# Patient Record
Sex: Female | Born: 1995 | Race: White | Hispanic: No | Marital: Single | Smoking: Never smoker
Health system: Southern US, Community
[De-identification: ages and names within clinical notes are randomized; demographics above are authoritative.]

## PROBLEM LIST (undated history)

## (undated) DIAGNOSIS — F988 Other specified behavioral and emotional disorders with onset usually occurring in childhood and adolescence: Secondary | ICD-10-CM

## (undated) DIAGNOSIS — K219 Gastro-esophageal reflux disease without esophagitis: Secondary | ICD-10-CM

## (undated) DIAGNOSIS — R161 Splenomegaly, not elsewhere classified: Secondary | ICD-10-CM

## (undated) DIAGNOSIS — D649 Anemia, unspecified: Secondary | ICD-10-CM

## (undated) DIAGNOSIS — F32A Depression, unspecified: Secondary | ICD-10-CM

## (undated) DIAGNOSIS — F419 Anxiety disorder, unspecified: Secondary | ICD-10-CM

## (undated) DIAGNOSIS — F329 Major depressive disorder, single episode, unspecified: Secondary | ICD-10-CM

## (undated) HISTORY — PX: FRACTURE SURGERY: SHX138

## (undated) HISTORY — DX: Other specified behavioral and emotional disorders with onset usually occurring in childhood and adolescence: F98.8

## (undated) HISTORY — DX: Gastro-esophageal reflux disease without esophagitis: K21.9

## (undated) HISTORY — PX: HERNIA REPAIR: SHX51

## (undated) HISTORY — DX: Anemia, unspecified: D64.9

## (undated) HISTORY — PX: TYMPANOSTOMY TUBE PLACEMENT: SHX32

---
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
## 2015-01-04 IMAGING — US US ABDOMEN COMPLETE
1 series · 14 of 25 positions shown · non-contrast
Comparison: None.

CLINICAL DATA: Fatigue.  Splenomegaly.

EXAM:
ULTRASOUND ABDOMEN COMPLETE

[Series 1: us abdomen complete · 0.28mm/px · 14 of 81 slices shown]
[im 1/81]
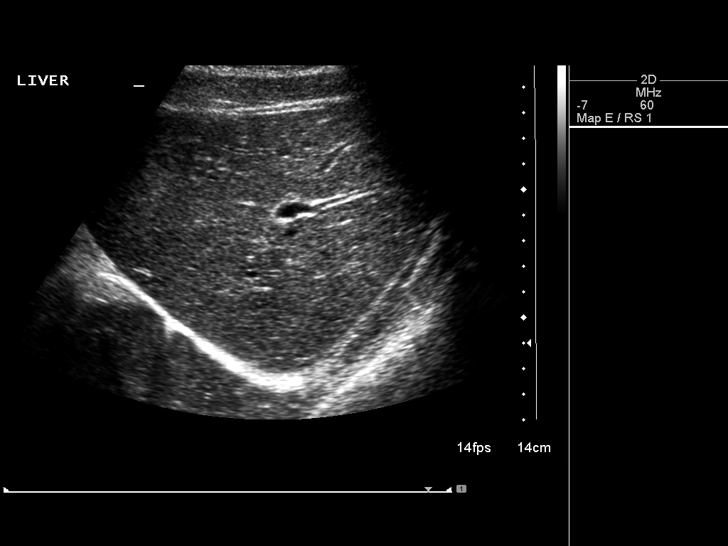
[im 7/81]
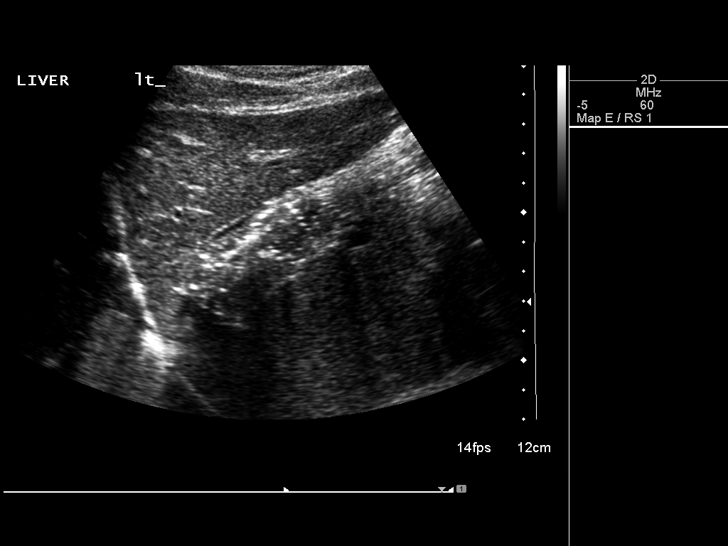
[im 14/81]
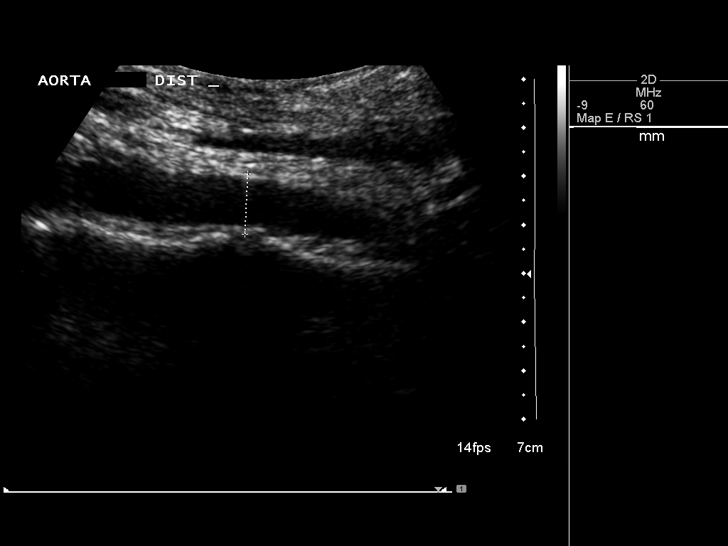
[im 21/81]
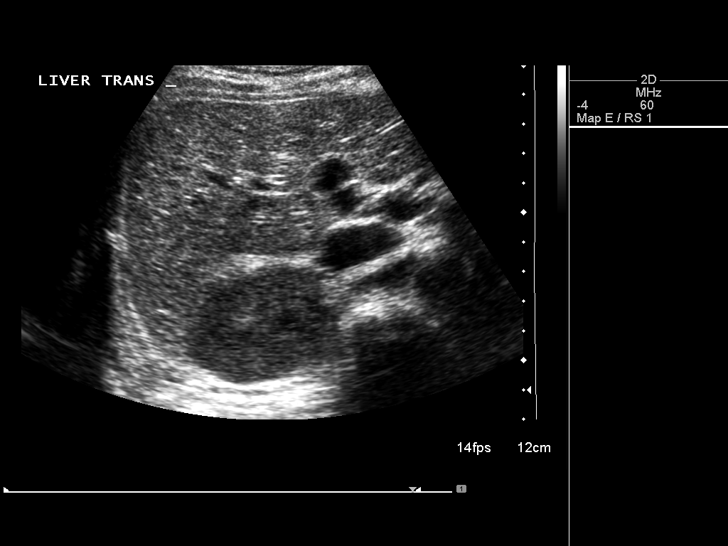
[im 27/81]
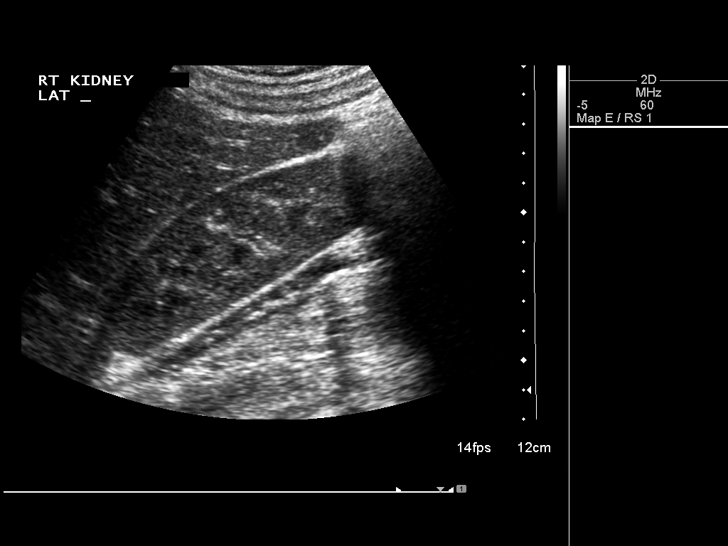
[im 31/81]
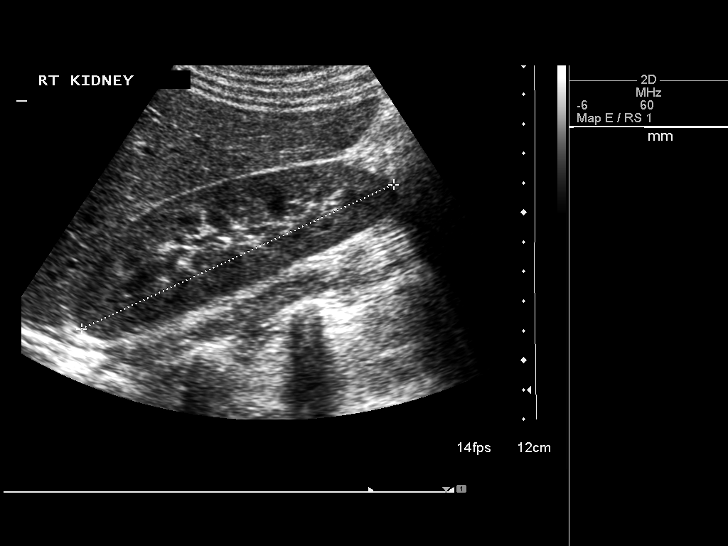
[im 37/81]
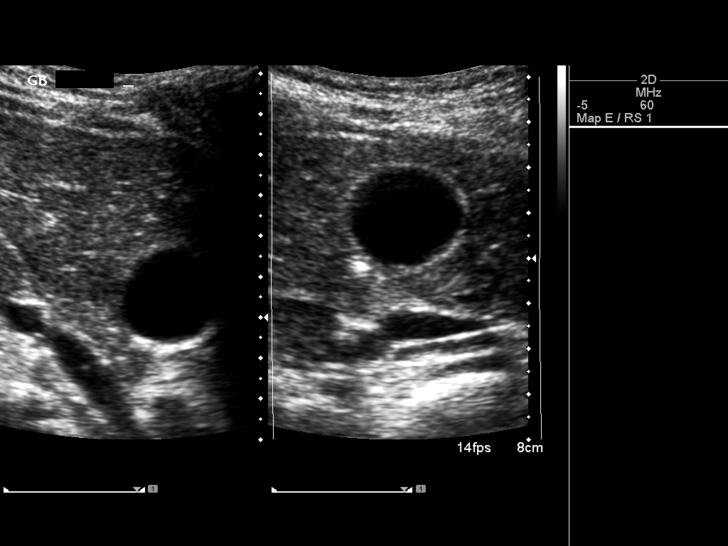
[im 44/81]
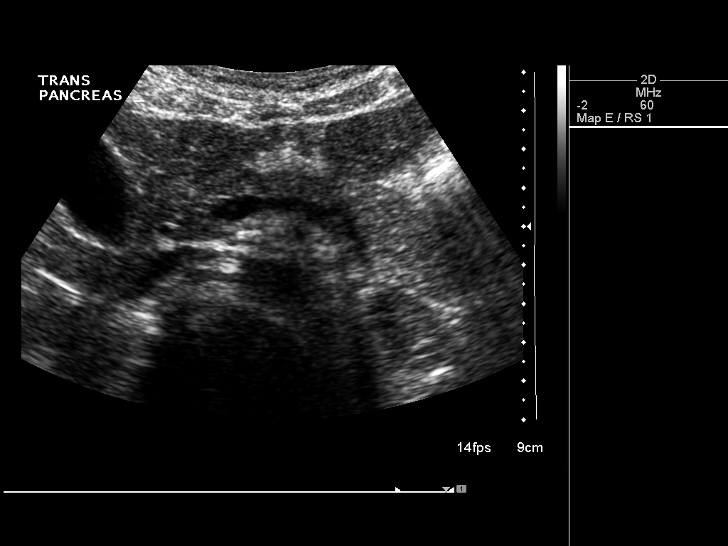
[im 51/81]
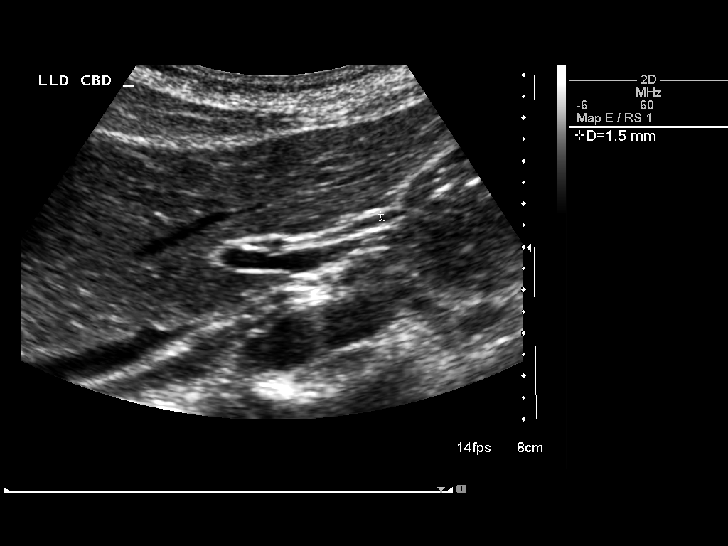
[im 54/81]
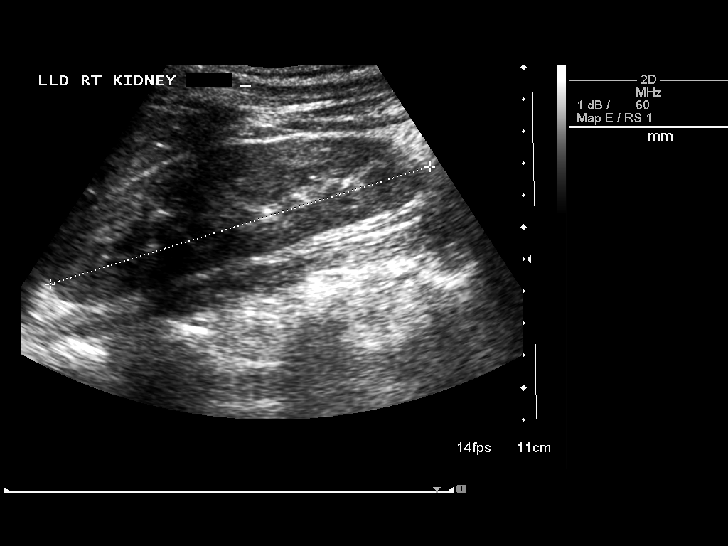
[im 61/81]
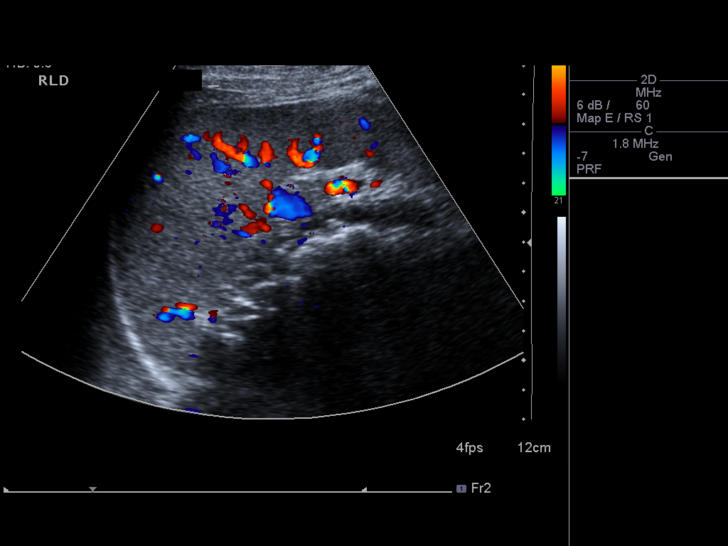
[im 67/81]
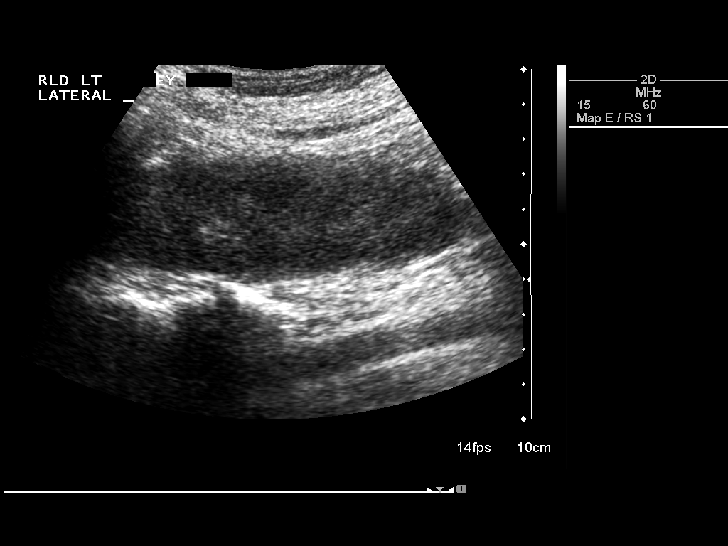
[im 74/81]
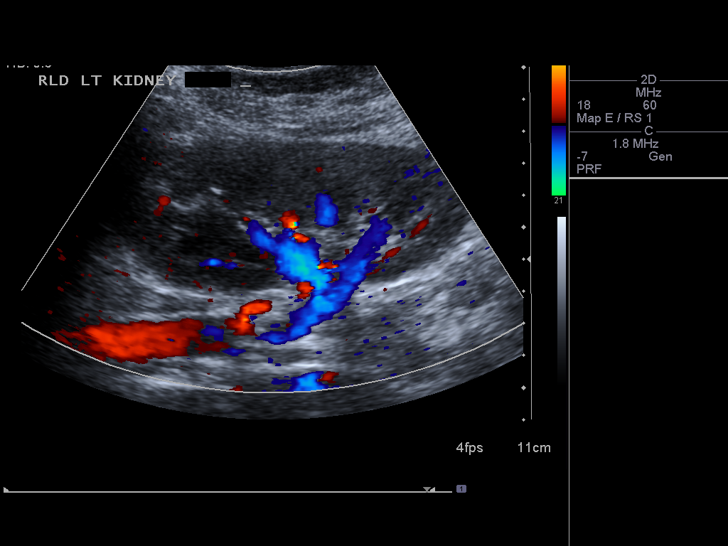
[im 81/81]
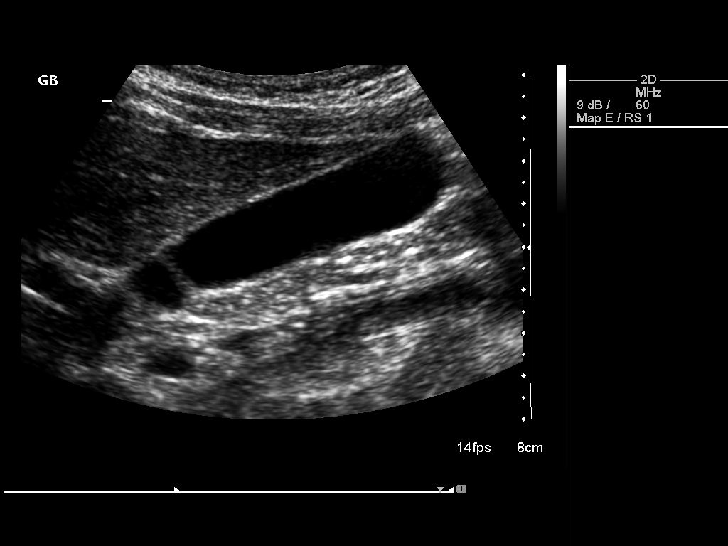

[14 of 25 positions shown; findings below may reference images not displayed]

FINDINGS: Gallbladder:

No gallstones or wall thickening visualized. No sonographic Murphy
sign noted.

Common bile duct:

Diameter: Normal, 2 mm

Liver:

No focal lesion identified. Within normal limits in parenchymal
echogenicity.

IVC:

No abnormality visualized.

Pancreas:

Visualized portion unremarkable.

Spleen:

Minimally enlarged. Measures 11.3 x 12.9 x 6.2 cm. Splenic volume of
473 cc. Upper normal 411 cc.

Right Kidney:

Length: 12.4 cm. Echogenicity within normal limits. No mass or
hydronephrosis visualized.

Left Kidney:

Length: 11.7 cm.. Echogenicity within normal limits. No mass or
hydronephrosis visualized.

Abdominal aorta:

No aneurysm visualized.

Other findings:

None.
IMPRESSION: 1. Mild splenomegaly.
2. Otherwise normal abdominal ultrasound.

## 2015-04-01 ENCOUNTER — Other Ambulatory Visit: Payer: Self-pay | Admitting: Physician Assistant

## 2015-05-04 IMAGING — CR DG RIBS W/ CHEST 3+V*R*
3 series · 3 of 3 positions shown · non-contrast
Comparison: Two-view chest 04/09/2013

CLINICAL DATA: Right rib and chest pain.  Fall yesterday.

EXAM:
RIGHT RIBS AND CHEST - 3+ VIEW

[PA]
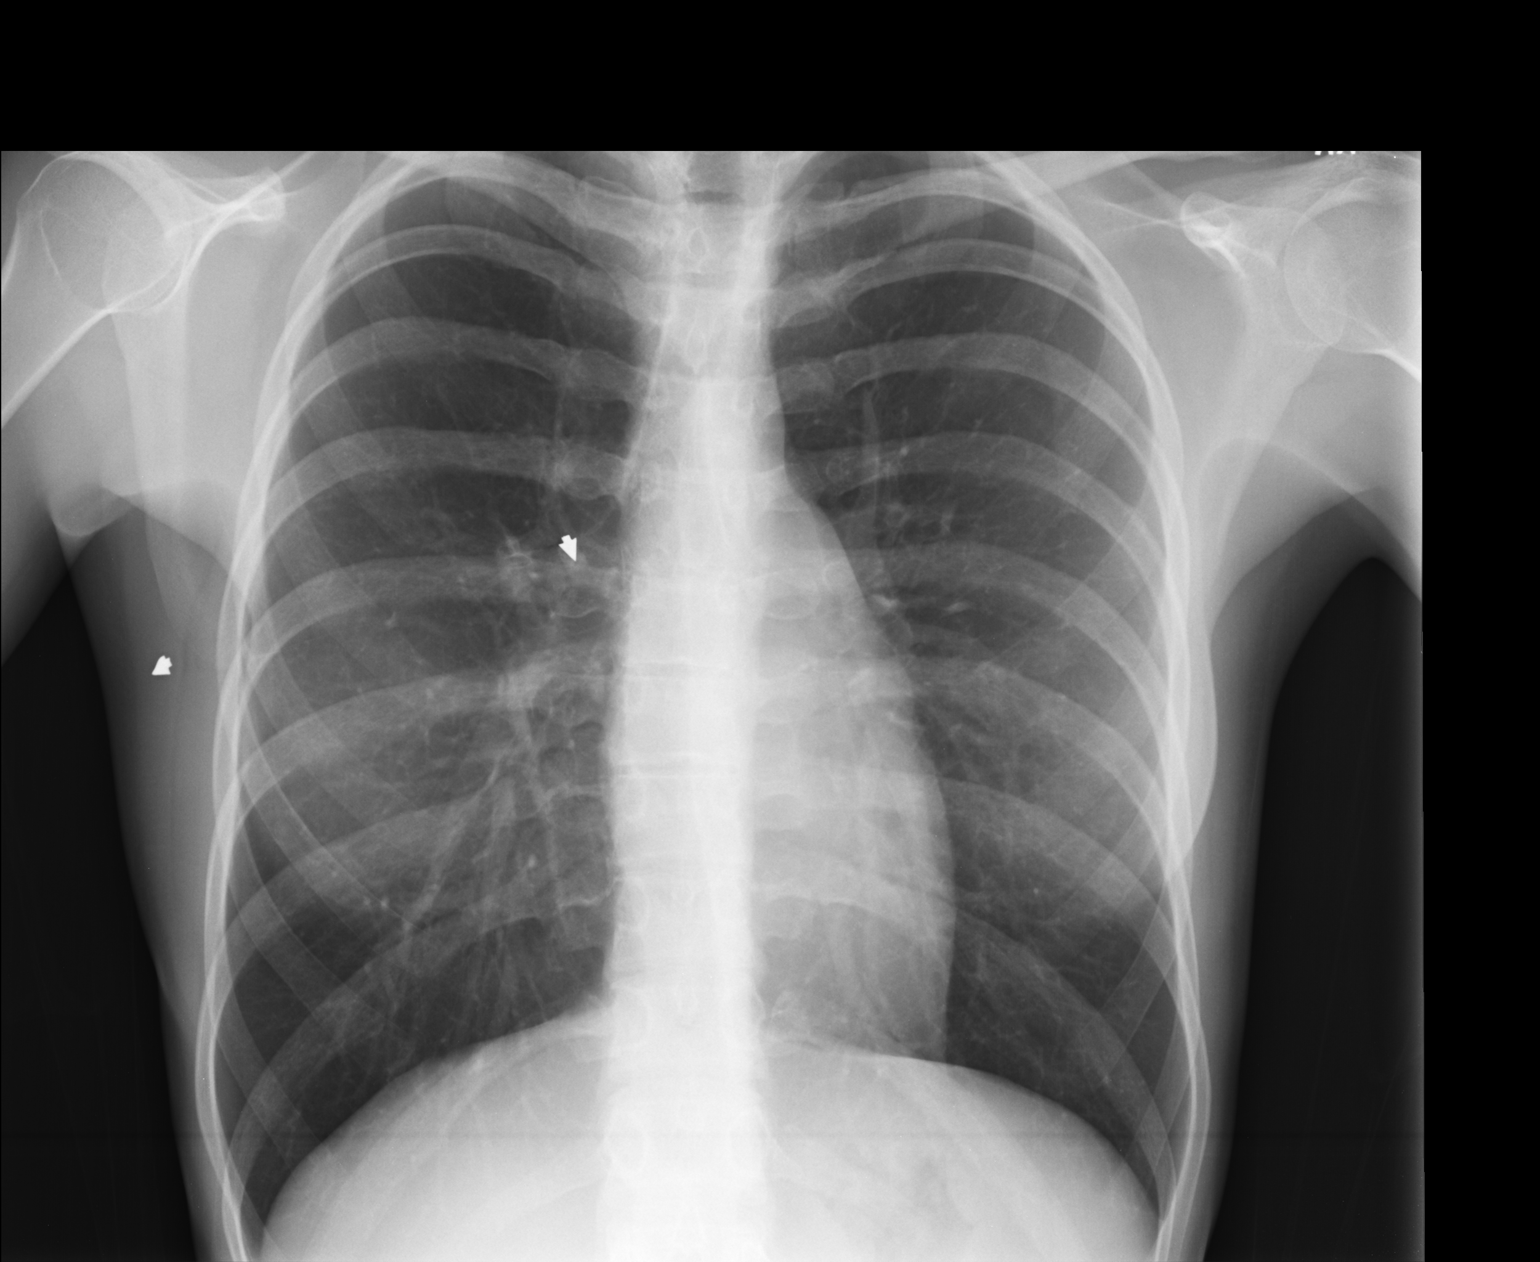

[rao]
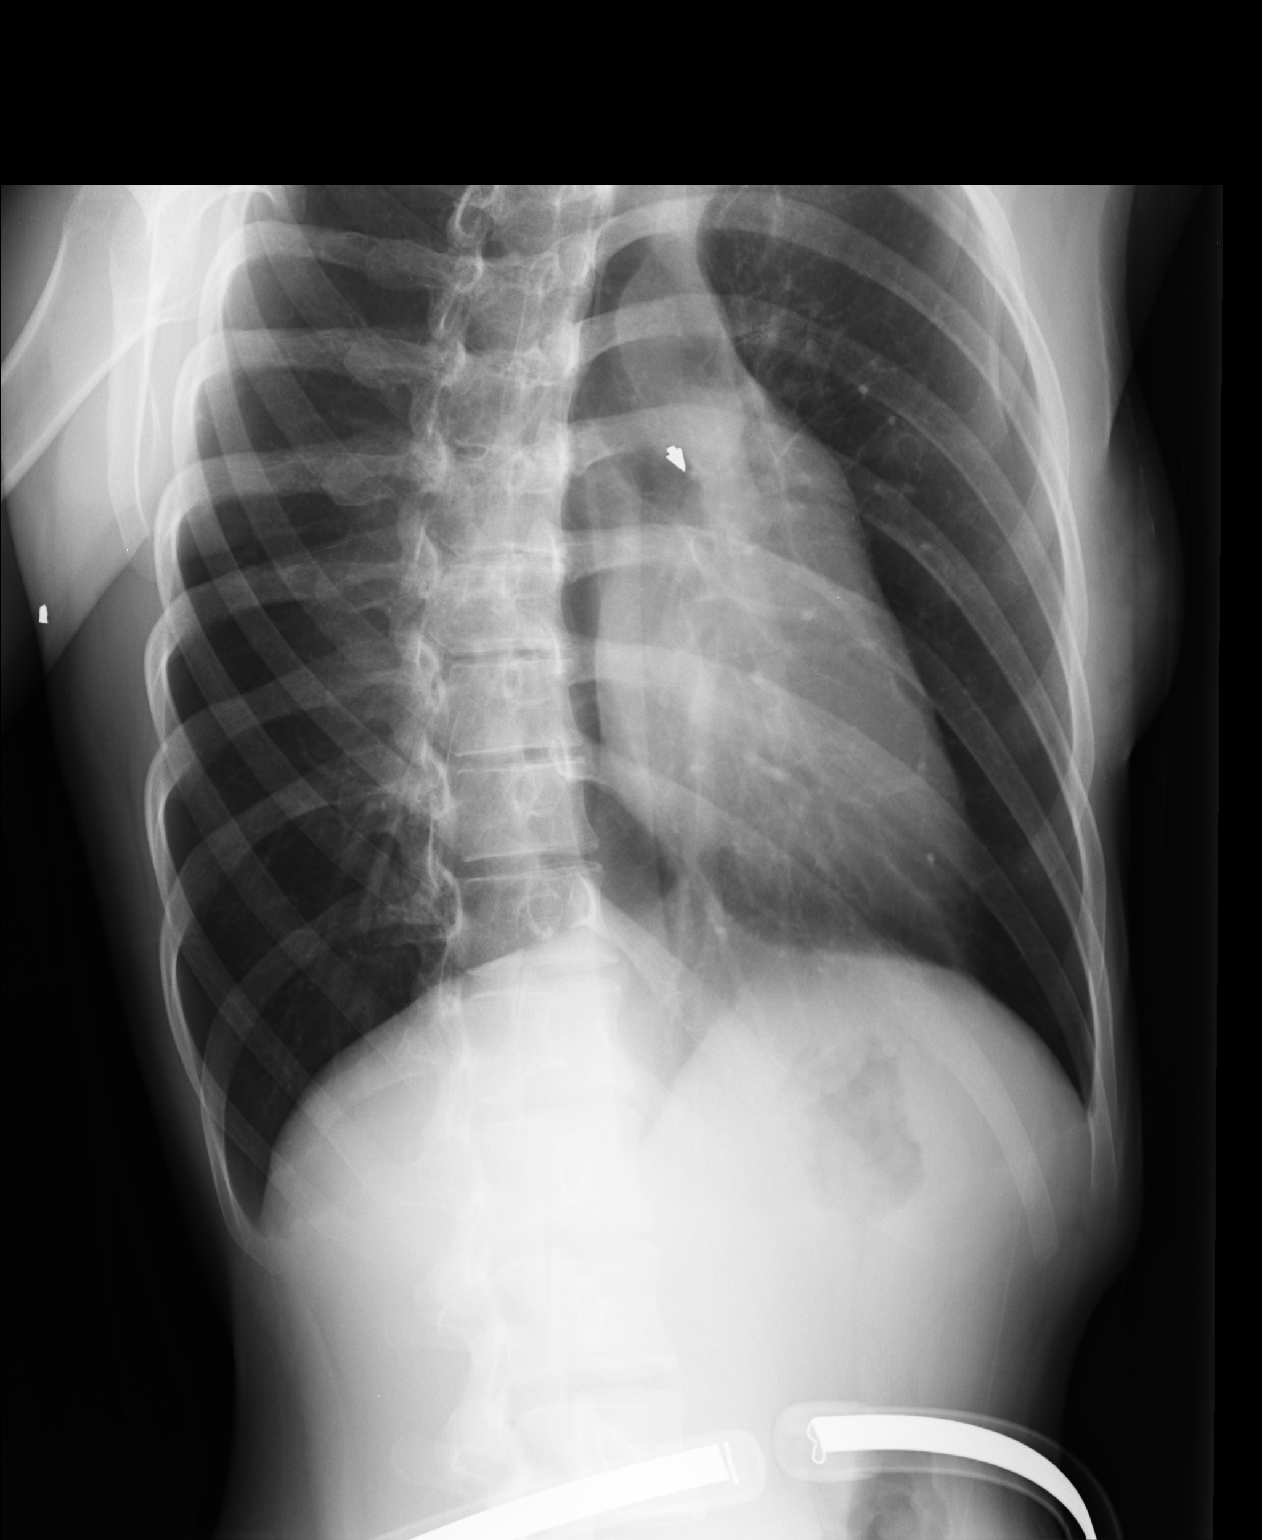

[lao]
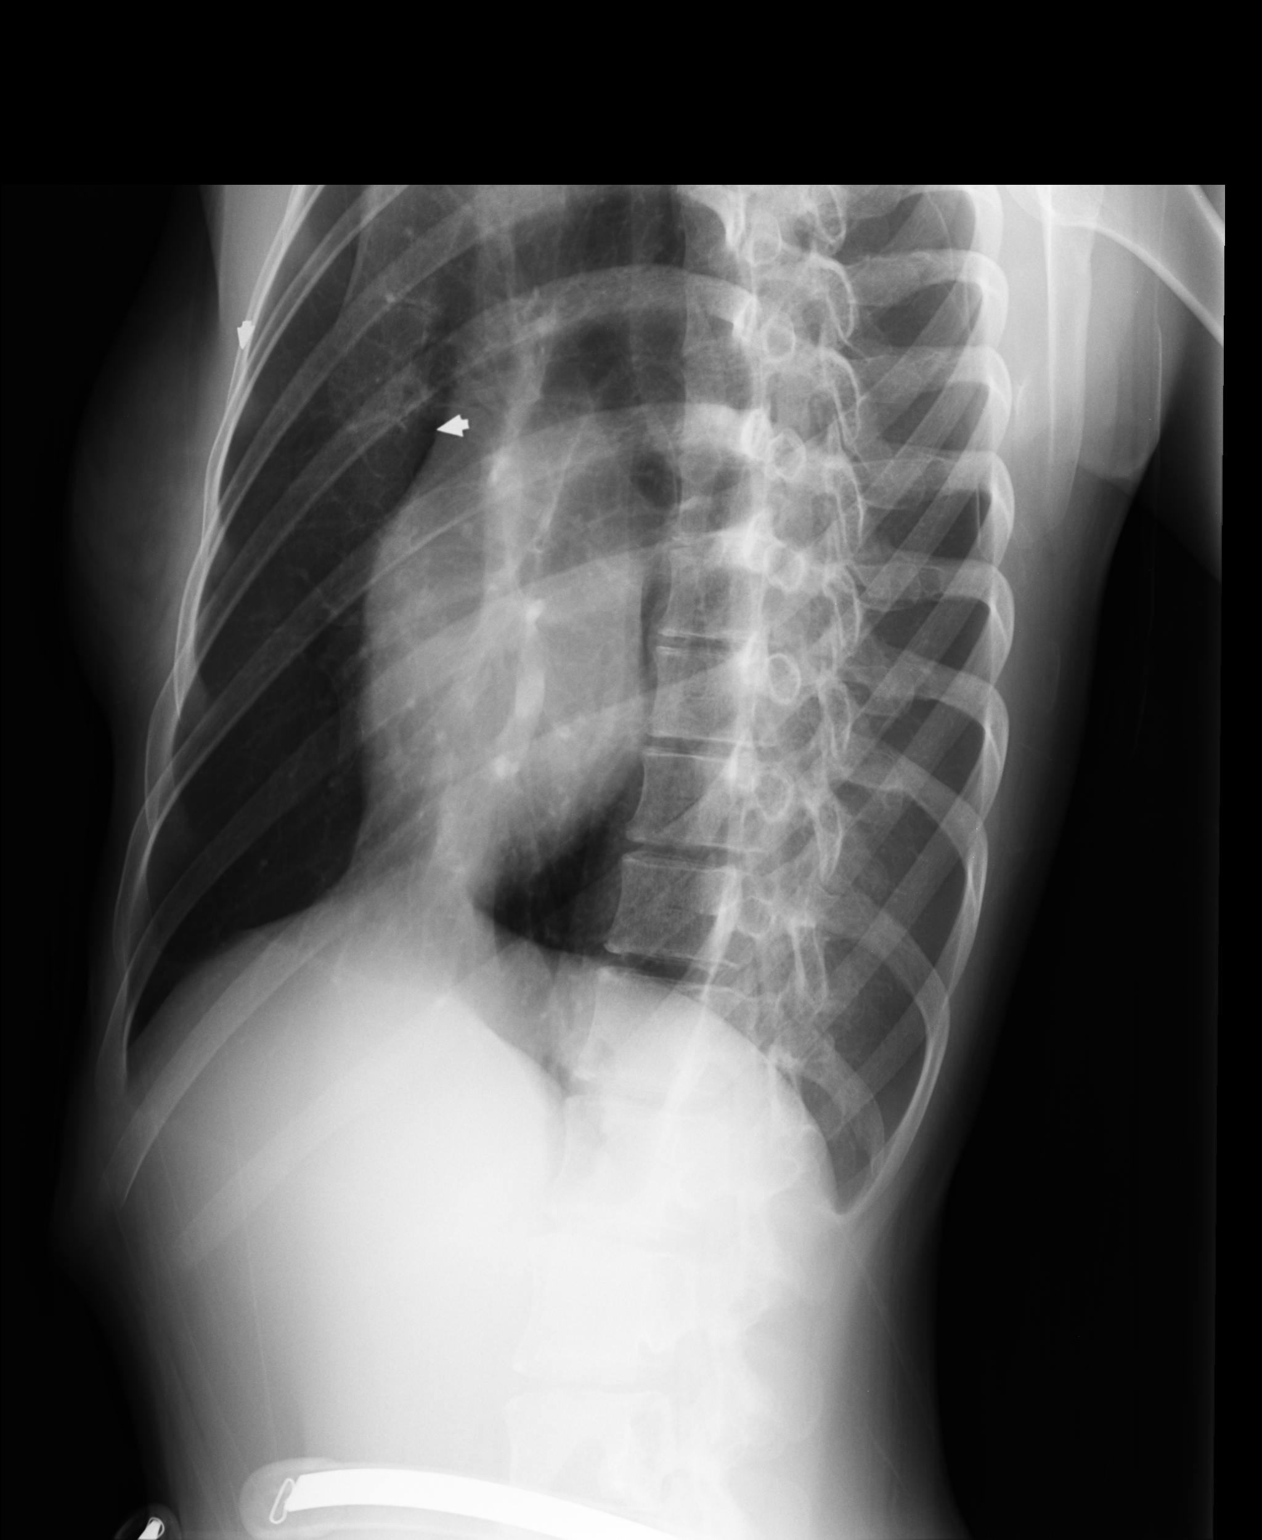

[3 of 3 positions shown; findings below may reference images not displayed]

FINDINGS: The heart size is normal. The lungs are clear. Dedicated imaging of
the ribs demonstrates no acute or healing fractures. The visualized
soft tissues and bony thorax are unremarkable.
IMPRESSION: 1. Negative two view scratch the negative chest x-ray and dedicated
rib films.

## 2015-08-29 ENCOUNTER — Ambulatory Visit (INDEPENDENT_AMBULATORY_CARE_PROVIDER_SITE_OTHER): Payer: BC Managed Care – PPO | Admitting: Physician Assistant

## 2015-08-29 ENCOUNTER — Encounter: Payer: Self-pay | Admitting: Physician Assistant

## 2015-08-29 VITALS — BP 116/72 | HR 101 | Temp 98.9°F | Resp 16 | Ht 66.5 in | Wt 137.0 lb

## 2015-08-29 DIAGNOSIS — Z130 Encounter for screening for diseases of the blood and blood-forming organs and certain disorders involving the immune mechanism: Secondary | ICD-10-CM

## 2015-08-29 DIAGNOSIS — Z139 Encounter for screening, unspecified: Secondary | ICD-10-CM | POA: Diagnosis not present

## 2015-08-29 DIAGNOSIS — J387 Other diseases of larynx: Secondary | ICD-10-CM | POA: Diagnosis not present

## 2015-08-29 DIAGNOSIS — Z0000 Encounter for general adult medical examination without abnormal findings: Secondary | ICD-10-CM | POA: Diagnosis not present

## 2015-08-29 DIAGNOSIS — Z1389 Encounter for screening for other disorder: Secondary | ICD-10-CM

## 2015-08-29 DIAGNOSIS — Z1329 Encounter for screening for other suspected endocrine disorder: Secondary | ICD-10-CM | POA: Diagnosis not present

## 2015-08-29 DIAGNOSIS — Z13228 Encounter for screening for other metabolic disorders: Secondary | ICD-10-CM | POA: Diagnosis not present

## 2015-08-29 DIAGNOSIS — Z118 Encounter for screening for other infectious and parasitic diseases: Secondary | ICD-10-CM | POA: Diagnosis not present

## 2015-08-29 DIAGNOSIS — K219 Gastro-esophageal reflux disease without esophagitis: Secondary | ICD-10-CM

## 2015-08-29 DIAGNOSIS — F418 Other specified anxiety disorders: Secondary | ICD-10-CM

## 2015-08-29 LAB — COMPREHENSIVE METABOLIC PANEL
ALT: 8 U/L (ref 6–29)
AST: 15 U/L (ref 10–30)
Albumin: 4.7 g/dL (ref 3.6–5.1)
Alkaline Phosphatase: 66 U/L (ref 33–115)
BUN: 11 mg/dL (ref 7–25)
CO2: 19 mmol/L — AB (ref 20–31)
CREATININE: 0.67 mg/dL (ref 0.50–1.10)
Calcium: 9.7 mg/dL (ref 8.6–10.2)
Chloride: 105 mmol/L (ref 98–110)
GLUCOSE: 92 mg/dL (ref 65–99)
POTASSIUM: 4 mmol/L (ref 3.5–5.3)
SODIUM: 139 mmol/L (ref 135–146)
TOTAL PROTEIN: 7.7 g/dL (ref 6.1–8.1)
Total Bilirubin: 0.5 mg/dL (ref 0.2–1.2)

## 2015-08-29 LAB — CBC WITH DIFFERENTIAL/PLATELET
BASOS ABS: 64 {cells}/uL (ref 0–200)
Basophils Relative: 1 %
EOS PCT: 3 %
Eosinophils Absolute: 192 cells/uL (ref 15–500)
HCT: 43 % (ref 35.0–45.0)
HEMOGLOBIN: 14.3 g/dL (ref 11.7–15.5)
Lymphocytes Relative: 43 %
Lymphs Abs: 2752 cells/uL (ref 850–3900)
MCH: 28.7 pg (ref 27.0–33.0)
MCHC: 33.3 g/dL (ref 32.0–36.0)
MCV: 86.2 fL (ref 80.0–100.0)
MONOS PCT: 6 %
MPV: 9 fL (ref 7.5–12.5)
Monocytes Absolute: 384 cells/uL (ref 200–950)
NEUTROS PCT: 47 %
Neutro Abs: 3008 cells/uL (ref 1500–7800)
PLATELETS: 325 10*3/uL (ref 140–400)
RBC: 4.99 MIL/uL (ref 3.80–5.10)
RDW: 13.2 % (ref 11.0–15.0)
WBC: 6.4 10*3/uL (ref 3.8–10.8)

## 2015-08-29 LAB — POC MICROSCOPIC URINALYSIS (UMFC): MUCUS RE: ABSENT

## 2015-08-29 LAB — POCT URINALYSIS DIP (MANUAL ENTRY)
BILIRUBIN UA: NEGATIVE
BILIRUBIN UA: NEGATIVE
GLUCOSE UA: NEGATIVE
Leukocytes, UA: NEGATIVE
Nitrite, UA: NEGATIVE
PROTEIN UA: NEGATIVE
SPEC GRAV UA: 1.025
Urobilinogen, UA: 0.2
pH, UA: 5.5

## 2015-08-29 LAB — TSH: TSH: 4.07 mIU/L

## 2015-08-29 MED ORDER — ESOMEPRAZOLE MAGNESIUM 40 MG PO CPDR: DELAYED_RELEASE_CAPSULE | ORAL | 3 refills | Status: AC

## 2015-08-29 NOTE — Progress Notes (Signed)
Subjective:    Patient ID: Renee Hall, female    DOB: 05-30-1995, 20 y.o.   MRN: [id number]  HPI Presents for annual wellness exam.  Cervical Cancer Screening: not yet indicated. Begin at age 10. Breast Cancer Screening: monthly SBE, annual CBE. Colorectal Cancer Screening: not yet a candidate. Bone Density Testing: not yet a candidate. HIV Screening: complete STI Screening: due for chlamydia screening Seasonal Influenza Vaccination: annually Td/Tdap Vaccination: current Pneumococcal Vaccination: not yet a candidate. Zoster Vaccination: not yet a candidate. Frequency of Dental evaluation: Q6 months Frequency of Eye evaluation: annually  Not currently sexually active. Most recent sex was with a female partner about a month ago.  Had a cold last week, that triggered the GERD/LPR   Review of Systems  Constitutional: Positive for fatigue.  HENT: Negative.   Eyes: Negative.   Respiratory: Negative.   Cardiovascular: Negative.   Gastrointestinal: Positive for vomiting.  Endocrine: Negative.   Genitourinary: Negative.   Musculoskeletal: Positive for back pain and neck pain.  Skin: Negative.   Allergic/Immunologic: Negative.   Neurological: Negative.   Hematological: Negative.   Psychiatric/Behavioral: The patient is nervous/anxious.        Objective:   Physical Exam  Constitutional: She is oriented to person, place, and time. Vital signs are normal. She appears well-developed and well-nourished. She is active and cooperative. No distress.  BP 116/72 (BP Location: Right Arm, Patient Position: Sitting, Cuff Size: Normal)   Pulse (!) 101   Temp 98.9 F (37.2 C) (Oral)   Resp 16   Ht 5' 6.5" (1.689 m)   Wt 137 lb (62.1 kg)   LMP 08/17/2015   SpO2 98%   BMI 21.78 kg/m    HENT:  Head: Normocephalic and atraumatic.  Right Ear: Hearing, tympanic membrane, external ear and ear canal normal. No foreign bodies.  Left Ear: Hearing, tympanic membrane, external ear and ear  canal normal. No foreign bodies.  Nose: Nose normal.  Mouth/Throat: Uvula is midline, oropharynx is clear and moist and mucous membranes are normal. No oral lesions. Normal dentition. No dental abscesses or uvula swelling. No oropharyngeal exudate.  Eyes: Conjunctivae, EOM and lids are normal. Pupils are equal, round, and reactive to light. Right eye exhibits no discharge. Left eye exhibits no discharge. No scleral icterus.  Fundoscopic exam:      The right eye shows no arteriolar narrowing, no AV nicking, no exudate, no hemorrhage and no papilledema. The right eye shows red reflex.       The left eye shows no arteriolar narrowing, no AV nicking, no exudate, no hemorrhage and no papilledema. The left eye shows red reflex.  Neck: Trachea normal, normal range of motion and full passive range of motion without pain. Neck supple. No spinous process tenderness and no muscular tenderness present. No thyroid mass and no thyromegaly present.  Cardiovascular: Normal rate, regular rhythm, normal heart sounds, intact distal pulses and normal pulses.   Pulmonary/Chest: Effort normal and breath sounds normal. Right breast exhibits no inverted nipple, no mass, no nipple discharge, no skin change and no tenderness. Left breast exhibits no inverted nipple, no mass, no nipple discharge, no skin change and no tenderness. Breasts are symmetrical.  Musculoskeletal: She exhibits no edema or tenderness.       Cervical back: Normal.       Thoracic back: Normal.       Lumbar back: Normal.  Lymphadenopathy:       Head (right side): No tonsillar, no preauricular, no  posterior auricular and no occipital adenopathy present.       Head (left side): No tonsillar, no preauricular, no posterior auricular and no occipital adenopathy present.    She has no cervical adenopathy.       Right: No supraclavicular adenopathy present.       Left: No supraclavicular adenopathy present.  Neurological: She is alert and oriented to person,  place, and time. She has normal strength and normal reflexes. No cranial nerve deficit. She exhibits normal muscle tone. Coordination and gait normal.  Skin: Skin is warm, dry and intact. No rash noted. She is not diaphoretic. No cyanosis or erythema. Nails show no clubbing.  Psychiatric: She has a normal mood and affect. Her speech is normal and behavior is normal. Judgment and thought content normal.   1. Annual physical exam Age appropriate anticipatory guidance provided.   2. LPRD (laryngopharyngeal reflux disease) 3. Gastroesophageal reflux disease without esophagitis Controlled. Continue lifestyle modification and PPI. - esomeprazole (NEXIUM) 40 MG capsule; Take one capsule by mouth one time daily  Dispense: 90 capsule; Refill: 3  4. Depression with anxiety Well-controlled.  5. Screening for chlamydial disease - GC/Chlamydia Probe Amp  6. Screening for thyroid disorder - TSH  7. Screening for blood or protein in urine - POCT urinalysis dipstick - POCT Microscopic Urinalysis ([facility])  8. Screening for metabolic disorder - Comprehensive metabolic panel  9. Screening for deficiency anemia - CBC with Differential/Platelet   Fernande Bras, PA-C Physician Assistant-Certified [facility]        Assessment & Plan:

## 2015-08-29 NOTE — Patient Instructions (Addendum)
   IF you received an x-ray today, you will receive an invoice from [facility]. Please contact [facility] at [phone] with questions or concerns regarding your invoice.   IF you received labwork today, you will receive an invoice from [facility]. Please contact [facility] at [phone] with questions or concerns regarding your invoice.   Our billing staff will not be able to assist you with questions regarding bills from these companies.  You will be contacted with the lab results as soon as they are available. The fastest way to get your results is to activate your My Chart account. Instructions are located on the last page of this paperwork. If you have not heard from us regarding the results in 2 weeks, please contact this office.     Keeping You Healthy  Get These Tests 1. Blood Pressure- Have your blood pressure checked once a year by your health care provider.  Normal blood pressure is 120/80. 2. Weight- Have your body mass index (BMI) calculated to screen for obesity.  BMI is measure of body fat based on height and weight.  You can also calculate your own BMI at www.nhlbisupport.com/bmi/. 3. Cholesterol- Have your cholesterol checked every 5 years starting at age 20 then yearly starting at age 45. 4. Chlamydia, HIV, and other sexually transmitted diseases- Get screened every year until age 25, then within three months of each new sexual provider. 5. Pap Test - Every 1-5 years; discuss with your health care provider. 6. Mammogram- Every 1-2 years starting at age 40--50  Take these medicines  Calcium with Vitamin D-Your body needs 1200 mg of Calcium each day and 800-1000 IU of Vitamin D daily.  Your body can only absorb 500 mg of Calcium at a time so Calcium must be taken in 2 or 3 divided doses throughout the day.  Multivitamin with folic acid- Once daily if it is possible for you to become pregnant.  Get these  Immunizations  Gardasil-Series of three doses; prevents HPV related illness such as genital warts and cervical cancer.  Menactra-Single dose; prevents meningitis.  Tetanus shot- Every 10 years.  Flu shot-Every year.  Take these steps 1. Do not smoke-Your healthcare provider can help you quit.  For tips on how to quit go to www.smokefree.gov or call 1-800 QUITNOW. 2. Be physically active- Exercise 5 days a week for at least 30 minutes.  If you are not already physically active, start slow and gradually work up to 30 minutes of moderate physical activity.  Examples of moderate activity include walking briskly, dancing, swimming, bicycling, etc. 3. Breast Cancer- A self breast exam every month is important for early detection of breast cancer.  For more information and instruction on self breast exams, ask your healthcare provider or www.womenshealth.gov/faq/breast-self-exam.cfm. 4. Eat a healthy diet- Eat a variety of healthy foods such as fruits, vegetables, whole grains, low fat milk, low fat cheeses, yogurt, lean meats, poultry and fish, beans, nuts, tofu, etc.  For more information go to www. Thenutritionsource.org 5. Drink alcohol in moderation- Limit alcohol intake to one drink or less per day. Never drink and drive. 6. Depression- Your emotional health is as important as your physical health.  If you're feeling down or losing interest in things you normally enjoy please talk to your healthcare provider about being screened for depression. 7. Dental visit- Brush and floss your teeth twice daily; visit your dentist twice a year. 8. Eye doctor- Get an eye exam at least every   2 years. 9. Helmet use- Always wear a helmet when riding a bicycle, motorcycle, rollerblading or skateboarding. 10. Safe sex- If you may be exposed to sexually transmitted infections, use a condom. 11. Seat belts- Seat belts can save your live; always wear one. 12. Smoke/Carbon Monoxide detectors- These detectors need to  be installed on the appropriate level of your home. Replace batteries at least once a year. 13. Skin cancer- When out in the sun please cover up and use sunscreen 15 SPF or higher. 14. Violence- If anyone is threatening or hurting you, please tell your healthcare provider.        

## 2015-08-31 ENCOUNTER — Encounter: Payer: Self-pay | Admitting: Physician Assistant

## 2015-10-31 ENCOUNTER — Telehealth: Payer: Self-pay

## 2015-10-31 NOTE — Telephone Encounter (Signed)
PATIENT'S MOTHER (JOANNA Drum) WOULD LIKE [name] TO KNOW THAT HER DA[facility] [place]. SHE WAS HAVING VOMITING AND BURPING UP AIR WITH HER  REFLUX. SHE THINKS THEY ARE GOING TO GIVE HER SOME FLUIDS. SHE JUST WANTED [name] TO KNOW AND THE HOSPITAL WILL SEND HER THE MEDICAL RECORDS FOR HER REVIEW.  BEST PHONE IS QUESTIONS [phone] - (MOM IS JOANNA Ohm)  MBC

## 2015-11-01 NOTE — Telephone Encounter (Signed)
FYI

## 2015-11-01 NOTE — Telephone Encounter (Signed)
Spoke with mom. Patient back in her dorm room, feeling much better with IV fluids. 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

## 2016-03-11 ENCOUNTER — Telehealth: Payer: Self-pay

## 2016-03-11 NOTE — Telephone Encounter (Signed)
[name]  Requesting [employer] - Matt HolmesWestminister, [place]   [phone]

## 2016-04-06 ENCOUNTER — Ambulatory Visit (INDEPENDENT_AMBULATORY_CARE_PROVIDER_SITE_OTHER): Payer: BC Managed Care – PPO | Admitting: Physician Assistant

## 2016-04-06 VITALS — BP 120/72 | HR 103 | Temp 97.4°F | Ht 66.5 in | Wt 139.8 lb

## 2016-04-06 DIAGNOSIS — R112 Nausea with vomiting, unspecified: Secondary | ICD-10-CM | POA: Diagnosis not present

## 2016-04-06 DIAGNOSIS — B349 Viral infection, unspecified: Secondary | ICD-10-CM | POA: Diagnosis not present

## 2016-04-06 DIAGNOSIS — J029 Acute pharyngitis, unspecified: Secondary | ICD-10-CM

## 2016-04-06 DIAGNOSIS — R05 Cough: Secondary | ICD-10-CM

## 2016-04-06 DIAGNOSIS — R0981 Nasal congestion: Secondary | ICD-10-CM | POA: Diagnosis not present

## 2016-04-06 DIAGNOSIS — R059 Cough, unspecified: Secondary | ICD-10-CM

## 2016-04-06 LAB — POCT URINALYSIS DIP (MANUAL ENTRY)
Bilirubin, UA: NEGATIVE
Glucose, UA: NEGATIVE
Ketones, POC UA: NEGATIVE
Nitrite, UA: NEGATIVE
Protein Ur, POC: 30 — AB
Spec Grav, UA: >=1.03
Urobilinogen, UA: 0.2
pH, UA: 5.5

## 2016-04-06 LAB — POCT RAPID STREP A (OFFICE): Rapid Strep A Screen: NEGATIVE

## 2016-04-06 MED ORDER — MUCINEX DM MAXIMUM STRENGTH 60-1200 MG PO TB12: 1.0000 | ORAL_TABLET | Freq: Two times a day (BID) | ORAL | 1 refills | Status: AC

## 2016-04-06 MED ORDER — BENZONATATE 100 MG PO CAPS: 100.0000 mg | ORAL_CAPSULE | Freq: Three times a day (TID) | ORAL | 0 refills | Status: AC | PRN

## 2016-04-06 MED ORDER — FLUTICASONE PROPIONATE 50 MCG/ACT NA SUSP: 2.0000 | Freq: Every day | NASAL | 6 refills | Status: AC

## 2016-04-06 NOTE — Patient Instructions (Addendum)
Flonase - 2 sprays each nostril twice a day.  Mucinex - please be sure to drink plenty of water while taking this.  Tessalon is for cough.  Sore throat: Warm tea with honey and lemon, warm salt water gargles (see below), or gargle liquid benadryl.  Come back if you are not better in 5-7 days.   Thank you for coming in today. I hope you feel we met your needs.  Feel free to call [facility] if you have any questions or further requests.  Please consider signing up for MyChart if you do not already have it, as this is a great way to communicate with me.  Best,  Whitney McVey, PA-C  Pharyngitis Pharyngitis is redness, pain, and swelling (inflammation) of your pharynx. What are the causes? Pharyngitis is usually caused by infection. Most of the time, these infections are from viruses (viral) and are part of a cold. However, sometimes pharyngitis is caused by bacteria (bacterial). Pharyngitis can also be caused by allergies. Viral pharyngitis may be spread from person to person by coughing, sneezing, and personal items or utensils (cups, forks, spoons, toothbrushes). Bacterial pharyngitis may be spread from person to person by more intimate contact, such as kissing. What are the signs or symptoms? Symptoms of pharyngitis include:  Sore throat.  Tiredness (fatigue).  Low-grade fever.  Headache.  Joint pain and muscle aches.  Skin rashes.  Swollen lymph nodes.  Plaque-like film on throat or tonsils (often seen with bacterial pharyngitis). How is this diagnosed? Your health care provider will ask you questions about your illness and your symptoms. Your medical history, along with a physical exam, is often all that is needed to diagnose pharyngitis. Sometimes, a rapid strep test is done. Other lab tests may also be done, depending on the suspected cause. How is this treated? Viral pharyngitis will usually get better in 3-4 days without the use of medicine. Bacterial pharyngitis is treated with  medicines that kill germs (antibiotics). Follow these instructions at home:  Drink enough water and fluids to keep your urine clear or pale yellow.  Only take over-the-counter or prescription medicines as directed by your health care provider:  If you are prescribed antibiotics, make sure you finish them even if you start to feel better.  Do not take aspirin.  Get lots of rest.  Gargle with 8 oz of salt water ( tsp of salt per 1 qt of water) as often as every 1-2 hours to soothe your throat.  Throat lozenges (if you are not at risk for choking) or sprays may be used to soothe your throat. Contact a health care provider if:  You have large, tender lumps in your neck.  You have a rash.  You cough up green, yellow-brown, or bloody spit. Get help right away if:  Your neck becomes stiff.  You drool or are unable to swallow liquids.  You vomit or are unable to keep medicines or liquids down.  You have severe pain that does not go away with the use of recommended medicines.  You have trouble breathing (not caused by a stuffy nose). This information is not intended to replace advice given to you by your health care provider. Make sure you discuss any questions you have with your health care provider. Document Released: 01/14/2005 Document Revised: 06/22/2015 Document Reviewed: 09/21/2012 Elsevier Interactive Patient Education  2017 [employer] Diet A bland diet consists of foods that do not have a lot of fat or fiber. Foods without  fat or fiber are easier for the body to digest. They are also less likely to irritate your mouth, throat, stomach, and other parts of your gastrointestinal tract. A bland diet is sometimes called a BRAT diet. What is my plan? Your health care provider or dietitian may recommend specific changes to your diet to prevent and treat your symptoms, such as:  Eating small meals often.  Cooking food until it is soft enough to chew  easily.  Chewing your food well.  Drinking fluids slowly.  Not eating foods that are very spicy, sour, or fatty.  Not eating citrus fruits, such as oranges and grapefruit. What do I need to know about this diet?  Eat a variety of foods from the bland diet food list.  Do not follow a bland diet longer than you have to.  Ask your health care provider whether you should take vitamins. What foods can I eat? Grains   Hot cereals, such as cream of wheat. Bread, crackers, or tortillas made from refined white flour. Rice. Vegetables  Canned or cooked vegetables. Mashed or boiled potatoes. Fruits  Bananas. Applesauce. Other types of cooked or canned fruit with the skin and seeds removed, such as canned peaches or pears. Meats and Other Protein Sources  Scrambled eggs. Creamy peanut butter or other nut butters. Lean, well-cooked meats, such as chicken or fish. Tofu. Soups or broths. Dairy  Low-fat dairy products, such as milk, cottage cheese, or yogurt. Beverages  Water. Herbal tea. Apple juice. Sweets and Desserts  Pudding. Custard. Fruit gelatin. Ice cream. Fats and Oils  Mild salad dressings. Canola or olive oil. The items listed above may not be a complete list of allowed foods or beverages. Contact your dietitian for more options.  What foods are not recommended? Foods and ingredients that are often not recommended include:  Spicy foods, such as hot sauce or salsa.  Fried foods.  Sour foods, such as pickled or fermented foods.  Raw vegetables or fruits, especially citrus or berries.  Caffeinated drinks.  Alcohol.  Strongly flavored seasonings or condiments. The items listed above may not be a complete list of foods and beverages that are not allowed. Contact your dietitian for more information.  This information is not intended to replace advice given to you by your health care provider. Make sure you discuss any questions you have with your health care  provider. Document Released: 05/08/2015 Document Revised: 06/22/2015 Document Reviewed: 01/26/2014 Elsevier Interactive Patient Education  2017 [employer].  IF you received an x-ray today, you will receive an invoice from [facility]. Please contact [facility] at [phone] with questions or concerns regarding your invoice.   IF you received labwork today, you will receive an invoice from [facility]. Please contact [facility] at [phone] with questions or concerns regarding your invoice.   Our billing staff will not be able to assist you with questions regarding bills from these companies.  You will be contacted with the lab results as soon as they are available. The fastest way to get your results is to activate your My Chart account. Instructions are located on the last page of this paperwork. If you have not heard from [place] regarding the results in 2 weeks, please contact this office.

## 2016-04-06 NOTE — Progress Notes (Signed)
Renee Hall  MRN: [id number] DOB: 06-Oct-1995  PCP: Porfirio Oar, PA-C  Subjective:  Pt is a 21 year old female who presents to clinic for cough and sore throat.  Last week she was vomiting with diarrhea x 3 days. She hadn't eaten for three days.  Saltine crackers and ginger ale. She is feeling much better but now, eating and drinking fluids ok.    Today c/o sore throat and cough. +sneezing, runny nose. She is not sleeping well.  Sick contacts: roommate and friends.  She did not have flu shot this year.   Denies fever, chills, sinus pain, ear pain, dizziness, lightheadedness, chest pain.  She has tried rest to help her feel better.   Review of Systems  Constitutional: Negative for chills, diaphoresis, fatigue and fever.  HENT: Positive for congestion, postnasal drip, rhinorrhea, sneezing and sore throat. Negative for sinus pressure.   Respiratory: Positive for cough. Negative for chest tightness, shortness of breath and wheezing.   Cardiovascular: Negative for chest pain and palpitations.  Gastrointestinal: Positive for diarrhea and vomiting. Negative for abdominal pain and nausea.  Neurological: Negative for weakness, light-headedness and headaches.    Patient Active Problem List   Diagnosis Date Noted  . Anemia 02/17/2013  . Depression with anxiety 01/29/2012  . Dyspnea 03/05/2011  . GERD (gastroesophageal reflux disease) 01/03/2011  . LPRD (laryngopharyngeal reflux disease)     Current Outpatient Prescriptions on File Prior to Visit  Medication Sig Dispense Refill  . esomeprazole (NEXIUM) 40 MG capsule Take one capsule by mouth one time daily 90 capsule 3  . [DISCONTINUED] amphetamine-dextroamphetamine (ADDERALL XR) 10 MG 24 hr capsule Take 1 capsule (10 mg total) by mouth daily. 30 capsule 0  . [DISCONTINUED] QVAR 80 MCG/ACT inhaler 2 puffs as needed.      No current facility-administered medications on file prior to visit.     Allergies  Allergen Reactions  .  Amoxicillin     Mood/behavioral abnormalities     Objective:  BP 120/72 (BP Location: Right Arm, Patient Position: Sitting, Cuff Size: Small)   Pulse (!) 103   Temp 97.4 F (36.3 C) (Oral)   Ht 5' 6.5" (1.689 m)   Wt 139 lb 12.8 oz (63.4 kg)   LMP 03/09/2016 (Approximate)   SpO2 100%   BMI 22.23 kg/m   Physical Exam  Constitutional: She is oriented to person, place, and time and well-developed, well-nourished, and in no distress. No distress.  HENT:  Right Ear: Tympanic membrane is bulging.  Left Ear: Tympanic membrane normal.  Nose: Mucosal edema present. No rhinorrhea. Right sinus exhibits no maxillary sinus tenderness and no frontal sinus tenderness. Left sinus exhibits no maxillary sinus tenderness and no frontal sinus tenderness.  Mouth/Throat: Mucous membranes are normal. Posterior oropharyngeal edema present.  Cardiovascular: Normal rate, regular rhythm and normal heart sounds.   Pulmonary/Chest: Effort normal and breath sounds normal.  Neurological: She is alert and oriented to person, place, and time. GCS score is 15.  Skin: Skin is warm and dry.  Psychiatric: Mood, memory, affect and judgment normal.  Vitals reviewed.   Results for orders placed or performed in visit on 04/06/16  POCT rapid strep A  Result Value Ref Range   Rapid Strep A Screen Negative Negative    Assessment and Plan :  1. Viral illness 2. Sore throat 3. Nausea and vomiting, intractability of vomiting not specified, unspecified vomiting type 4. Cough 5. Nasal congestion - POCT rapid strep A - Basic Metabolic  Panel - POCT urinalysis dipstick - benzonatate (TESSALON) 100 MG capsule; Take 1-2 capsules (100-200 mg total) by mouth 3 (three) times daily as needed for cough.  Dispense: 40 capsule; Refill: 0 - fluticasone (FLONASE) 50 MCG/ACT nasal spray; Place 2 sprays into both nostrils daily.  Dispense: 16 g; Refill: 6 - Dextromethorphan-Guaifenesin (MUCINEX DM MAXIMUM STRENGTH) 60-1200 MG TB12;  Take 1 tablet by mouth every 12 (twelve) hours.  Dispense: 14 each; Refill: 1 - Suspect viral illness. Supportive care encouraged: Push fluids, rest, bland diet x 5 days. RTC in 5-7 days if no improvement.    Marco CollieWhitney Leandro Berkowitz, PA-C  [facility] at [facility] 04/06/2016 2:34 PM

## 2016-04-07 LAB — BASIC METABOLIC PANEL
BUN/Creatinine Ratio: 18 (ref 9–23)
BUN: 15 mg/dL (ref 6–20)
Creatinine, Ser: 0.83 mg/dL (ref 0.57–1.00)
GFR calc Af Amer: 117 mL/min/{1.73_m2} (ref >59)
Potassium: 4.8 mmol/L (ref 3.5–5.2)

## 2016-04-07 LAB — BASIC METABOLIC PANEL WITH GFR
CO2: 25 mmol/L (ref 18–29)
Calcium: 9.9 mg/dL (ref 8.7–10.2)
Chloride: 98 mmol/L (ref 96–106)
GFR calc non Af Amer: 102 mL/min/{1.73_m2} (ref >59)
Glucose: 66 mg/dL (ref 65–99)
Sodium: 137 mmol/L (ref 134–144)

## 2016-04-08 ENCOUNTER — Encounter: Payer: Self-pay | Admitting: Physician Assistant

## 2016-04-09 ENCOUNTER — Ambulatory Visit: Payer: BC Managed Care – PPO

## 2016-11-04 ENCOUNTER — Telehealth: Payer: Self-pay | Admitting: Physician Assistant

## 2016-11-04 NOTE — Telephone Encounter (Signed)
Pt mom is needing to talk with [name] about pt vomitting and reflux  issues and a referral that would be able to be done in [place] where pt is away at school  Best number [phone]

## 2016-11-04 NOTE — Telephone Encounter (Signed)
Please call patient's mother, Mardene Celeste. Get details. Is this what she has experienced before?  [facility] [employer] is may be able to recommend specialists, and either they can refer or I can (if she'll give me names).

## 2016-11-05 NOTE — Telephone Encounter (Signed)
I've reviewed the record, and I don't see any other medication for this other than zofran (ondansetron). She saw Dr. Chestine Spore here and Dr. Alphonzo Grieve at [facility]. They are both pediatric GI specialists, so while they may be able to provide the name of the medication she used before, she still probably needs to see someone where she is. I recommend that she start with the student health center. They can refer to a local GI specialist, presuming that's what she needs. Perhaps her mother can contact the school? A residence hall assistant or the [employer] may be able to help get Aneth to the health center. We need to understand why the symptoms have recurred (what kind of stress is she under in college?), so that can be addressed.

## 2016-11-05 NOTE — Telephone Encounter (Signed)
Pt's mother states the symptoms are worse and pt throws up every morning and is having the same experience has last time. Mom has told the pt to go to the student health center but she hasn't gone yet. Mom couldn't remember if it was [name] or Pt's gastroenterologist who recommended a medication that could help with the stomach spasms. Mom couldn't remember the name of the medication. Mom also reports that the pt is only taking the Nexium and has stopped all other medications.

## 2016-11-06 NOTE — Telephone Encounter (Signed)
Pt advised to go to student and health and patients stated she would try and get there today.

## 2017-05-01 ENCOUNTER — Encounter: Payer: Self-pay | Admitting: Physician Assistant
